# Patient Record
Sex: Female | Born: 2005 | Race: White | Hispanic: Yes | Marital: Single | Smoking: Never smoker
Health system: Southern US, Community
[De-identification: ages and names within clinical notes are randomized; demographics above are authoritative.]

## PROBLEM LIST (undated history)

## (undated) ENCOUNTER — Inpatient Hospital Stay (HOSPITAL_COMMUNITY): Payer: Self-pay

## (undated) DIAGNOSIS — D649 Anemia, unspecified: Secondary | ICD-10-CM

## (undated) HISTORY — PX: NO PAST SURGERIES: SHX2092

---
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
## 2024-01-03 ENCOUNTER — Ambulatory Visit: Payer: BC Managed Care – PPO | Admitting: Cardiology

## 2024-03-20 ENCOUNTER — Other Ambulatory Visit (HOSPITAL_COMMUNITY)
Admission: RE | Admit: 2024-03-20 | Discharge: 2024-03-20 | Disposition: A | Discharge status: Home / Self Care | Source: Ambulatory Visit | Attending: Obstetrics & Gynecology | Admitting: Obstetrics & Gynecology

## 2024-03-20 ENCOUNTER — Ambulatory Visit (INDEPENDENT_AMBULATORY_CARE_PROVIDER_SITE_OTHER): Payer: Self-pay

## 2024-03-20 VITALS — BP 113/76 | HR 79

## 2024-03-20 DIAGNOSIS — Z113 Encounter for screening for infections with a predominantly sexual mode of transmission: Secondary | ICD-10-CM | POA: Insufficient documentation

## 2024-03-20 NOTE — Progress Notes (Signed)
 SUBJECTIVE:  18 y.o. female who desires a STI screen. Denies abnormal vaginal discharge, bleeding or significant pelvic pain. No UTI symptoms. Denies history of known exposure to STD.  Patient's last menstrual period was 03/07/2024 (exact date).  OBJECTIVE:  She appears well.   ASSESSMENT:  STI Screen   PLAN:  Pt offered STI blood screening-requested GC, chlamydia, and trichomonas probe sent to lab.  Treatment: To be determined once lab results are received.  Pt follow up as needed.

## 2024-03-21 LAB — CERVICOVAGINAL ANCILLARY ONLY
Bacterial Vaginitis (gardnerella): POSITIVE — AB
Candida Glabrata: NEGATIVE
Candida Vaginitis: NEGATIVE
Chlamydia: NEGATIVE
Comment: NEGATIVE
Comment: NEGATIVE
Comment: NEGATIVE
Comment: NEGATIVE
Comment: NEGATIVE
Comment: NORMAL
Neisseria Gonorrhea: NEGATIVE
Trichomonas: NEGATIVE

## 2024-03-22 LAB — HIV ANTIBODY (ROUTINE TESTING W REFLEX): HIV Screen 4th Generation wRfx: NONREACTIVE

## 2024-03-22 LAB — HEPATITIS C ANTIBODY: Hep C Virus Ab: NONREACTIVE

## 2024-03-22 LAB — RPR: RPR Ser Ql: NONREACTIVE

## 2024-03-22 LAB — HEPATITIS B SURFACE ANTIGEN: Hepatitis B Surface Ag: NEGATIVE

## 2024-04-03 ENCOUNTER — Ambulatory Visit: Payer: BC Managed Care – PPO | Attending: Cardiology | Admitting: Cardiology

## 2024-04-16 ENCOUNTER — Other Ambulatory Visit: Payer: Self-pay | Admitting: Obstetrics & Gynecology

## 2024-04-16 DIAGNOSIS — Z113 Encounter for screening for infections with a predominantly sexual mode of transmission: Secondary | ICD-10-CM

## 2024-08-21 ENCOUNTER — Ambulatory Visit (INDEPENDENT_AMBULATORY_CARE_PROVIDER_SITE_OTHER)

## 2024-08-21 ENCOUNTER — Ambulatory Visit

## 2024-08-21 VITALS — BP 137/86 | HR 79

## 2024-08-21 DIAGNOSIS — Z3201 Encounter for pregnancy test, result positive: Secondary | ICD-10-CM | POA: Diagnosis not present

## 2024-08-21 LAB — POCT URINE PREGNANCY: Preg Test, Ur: POSITIVE — AB

## 2024-08-21 MED ORDER — PROMETHAZINE HCL 25 MG PO TABS: 25.0000 mg | ORAL_TABLET | Freq: Four times a day (QID) | ORAL | 1 refills | Status: AC | PRN

## 2024-08-21 MED ORDER — PRENATAL 28-0.8 MG PO TABS: 1.0000 | ORAL_TABLET | Freq: Every day | ORAL | 12 refills | Status: AC

## 2024-08-21 NOTE — Progress Notes (Signed)
 Kayla Newton here for a UPT. Pt had a positive upt at home. LMP is 07/20/24.     UPT in office Positive.    Reviewed medications and informed to start a PNV, if not already. Pt to follow up in 4 weeks for New OB visit.   Pt did request HCG blood draw, orders placed.

## 2024-08-22 ENCOUNTER — Ambulatory Visit: Payer: Self-pay | Admitting: Obstetrics and Gynecology

## 2024-08-22 LAB — BETA HCG QUANT (REF LAB): hCG Quant: 379 m[IU]/mL

## 2024-09-18 ENCOUNTER — Ambulatory Visit: Admitting: *Deleted

## 2024-09-18 ENCOUNTER — Other Ambulatory Visit (INDEPENDENT_AMBULATORY_CARE_PROVIDER_SITE_OTHER): Payer: Self-pay

## 2024-09-18 ENCOUNTER — Other Ambulatory Visit (HOSPITAL_COMMUNITY)
Admission: RE | Admit: 2024-09-18 | Discharge: 2024-09-18 | Disposition: A | Discharge status: Home / Self Care | Source: Ambulatory Visit | Attending: Obstetrics and Gynecology | Admitting: Obstetrics and Gynecology

## 2024-09-18 VITALS — BP 124/87 | HR 85 | Wt 125.3 lb

## 2024-09-18 DIAGNOSIS — Z3480 Encounter for supervision of other normal pregnancy, unspecified trimester: Secondary | ICD-10-CM

## 2024-09-18 DIAGNOSIS — Z3A08 8 weeks gestation of pregnancy: Secondary | ICD-10-CM | POA: Diagnosis not present

## 2024-09-18 DIAGNOSIS — Z3481 Encounter for supervision of other normal pregnancy, first trimester: Secondary | ICD-10-CM

## 2024-09-18 DIAGNOSIS — O3680X0 Pregnancy with inconclusive fetal viability, not applicable or unspecified: Secondary | ICD-10-CM

## 2024-09-18 MED ORDER — BLOOD PRESSURE KIT DEVI: 1.0000 | 0 refills | Status: AC

## 2024-09-18 NOTE — Progress Notes (Signed)
 New OB Intake  I connected with Kayla Newton  on 09/18/24 at  8:15 AM EST by In Person Visit and verified that I am speaking with the correct person using two identifiers. Nurse is located at [facility] and pt is located at [place].  I discussed the limitations, risks, security and privacy concerns of performing an evaluation and management service by telephone and the availability of in person appointments. I also discussed with the patient that there may be a patient responsible charge related to this service. The patient expressed understanding and agreed to proceed.  I explained I am completing New OB Intake today. We discussed EDD of 04/26/25 based on LMP of 07/20/25. Pt is G2P1001. I reviewed her allergies, medications and Medical/Surgical/OB history.    Patient Active Problem List   Diagnosis Date Noted   Breakthrough bleeding associated with intrauterine device (IUD) 01/10/2023   IUD (intrauterine device) in place 01/10/2023     Concerns addressed today  Delivery Plans Plans to deliver at [facility]. Discussed the nature of our practice with multiple providers including residents and students as well as female and female providers. Due to the size of the practice, the delivering provider may not be the same as those providing prenatal care.   Patient Not Asked interested in water birth.  MyChart/Babyscripts MyChart access verified. I explained pt will have some visits in office and some virtually. Babyscripts instructions given and order placed. Patient verifies receipt of registration text/e-mail. Account successfully created and app downloaded. If patient is a candidate for Optimized scheduling, add to sticky note.   Blood Pressure Cuff/Weight Scale Blood pressure cuff ordered for patient to pick-up from [employer]. Explained after first prenatal appt pt will check weekly and document in Babyscripts. Patient does not have weight scale; patient may purchase if they desire to  track weight weekly in Babyscripts.  Anatomy US  Explained first scheduled US  will be around 19 weeks. Anatomy US  scheduled for TBD at TBD.  Is patient a CenteringPregnancy candidate?  Declined Declined due to Group setting Not a candidate due to NA If accepted,    Is patient a candidate for Babyscripts Optimization? Yes, patient accepted    First visit review I reviewed new OB appt with patient. Explained pt will be seen by Dr. Erik at first visit. Discussed Kayla Newton genetic screening with patient. Requests Panorama. Routine prenatal labs OB Urine, GC/CC collected at today's visit. Initial prenatal labs deferred to New OB visit.   Last Pap No results found for: DIAGPAP  Kayla CHRISTELLA Ober, RN 09/18/2024  8:24 AM

## 2024-09-18 NOTE — Patient Instructions (Signed)

## 2024-09-20 LAB — CERVICOVAGINAL ANCILLARY ONLY
Chlamydia: NEGATIVE
Comment: NEGATIVE
Comment: NORMAL
Neisseria Gonorrhea: NEGATIVE

## 2024-09-21 LAB — URINE CULTURE, OB REFLEX

## 2024-09-21 LAB — CULTURE, OB URINE

## 2024-09-26 ENCOUNTER — Ambulatory Visit: Payer: Self-pay | Admitting: Obstetrics and Gynecology

## 2024-10-16 ENCOUNTER — Encounter: Admitting: Obstetrics and Gynecology

## 2024-10-24 ENCOUNTER — Ambulatory Visit: Admitting: Obstetrics & Gynecology

## 2024-10-24 VITALS — BP 125/80 | HR 87 | Wt 125.0 lb

## 2024-10-24 DIAGNOSIS — Z3480 Encounter for supervision of other normal pregnancy, unspecified trimester: Secondary | ICD-10-CM

## 2024-10-24 DIAGNOSIS — Z3A13 13 weeks gestation of pregnancy: Secondary | ICD-10-CM | POA: Diagnosis not present

## 2024-10-24 DIAGNOSIS — Z3483 Encounter for supervision of other normal pregnancy, third trimester: Secondary | ICD-10-CM

## 2024-10-24 DIAGNOSIS — Z131 Encounter for screening for diabetes mellitus: Secondary | ICD-10-CM

## 2024-10-24 NOTE — Progress Notes (Unsigned)
°  Subjective:NOB    Kayla Newton is a G2P1001 [redacted]w[redacted]d being seen today for her first obstetrical visit.  Her obstetrical history is significant for second pregnancy. Patient does intend to breast feed. Pregnancy history fully reviewed.  Patient reports no complaints.  Vitals:   10/24/24 0936  BP: 125/80  Pulse: 87  Weight: 125 lb (56.7 kg)    HISTORY: OB History  Gravida Para Term Preterm AB Living  2 1 1  0 0 1  SAB IAB Ectopic Multiple Live Births  0 0 0 0 1    # Outcome Date GA Lbr Len/2nd Weight Sex Type Anes PTL Lv  2 Current           1 Term 07/08/21 [redacted]w[redacted]d 19:27 / 00:23 6 lb 14.6 oz (3.135 kg) F Vag-Spont Local  LIV   Past Medical History:  Diagnosis Date   Anemia    Past Surgical History:  Procedure Laterality Date   NO PAST SURGERIES     Family History  Problem Relation Age of Onset   Hypertension Mother    Hyperlipidemia Father      Exam    Uterus:   14 week  Pelvic Exam:    Perineum:    Vulva:    Vagina:     pH:    Cervix:    Adnexa:    Bony Pelvis:   System: Breast:  Inspection negative   Skin: normal coloration and turgor, no rashes    Neurologic: oriented, normal mood   Extremities: normal strength, tone, and muscle mass   HEENT PERRLA and extra ocular movement intact   Mouth/Teeth mucous membranes moist, pharynx normal without lesions   Neck supple and no masses   Cardiovascular: regular rate and rhythm, no murmurs or gallops   Respiratory:  appears well, vitals normal, no respiratory distress, acyanotic, normal RR, chest clear, no wheezing, crepitations, rhonchi, normal symmetric air entry   Abdomen: soft, non-tender; bowel sounds normal; no masses,  no organomegaly   Urinary:       Assessment:    Pregnancy: G2P1001 Patient Active Problem List   Diagnosis Date Noted   Supervision of other normal pregnancy, antepartum 09/18/2024   Breakthrough bleeding associated with intrauterine device (IUD) 01/10/2023   IUD (intrauterine  device) in place 01/10/2023        Plan:     Initial labs drawn. Prenatal vitamins. Problem list reviewed and updated. Genetic Screening discussed : ordered.  Ultrasound discussed; fetal survey: ordered.  Follow up in 4 weeks. 50% of 30 min visit spent on counseling and coordination of care.     Lynwood Solomons 10/24/2024

## 2024-10-25 LAB — CBC/D/PLT+RPR+RH+ABO+RUBIGG...
Antibody Screen: NEGATIVE
Basophils Absolute: 0 x10E3/uL (ref 0.0–0.2)
Basos: 0 %
EOS (ABSOLUTE): 0.4 x10E3/uL (ref 0.0–0.4)
Eos: 5 %
HCV Ab: NONREACTIVE
HIV Screen 4th Generation wRfx: NONREACTIVE
Hematocrit: 38.9 % (ref 34.0–46.6)
Hemoglobin: 12.5 g/dL (ref 11.1–15.9)
Hepatitis B Surface Ag: NEGATIVE
Immature Grans (Abs): 0 x10E3/uL (ref 0.0–0.1)
Immature Granulocytes: 0 %
Lymphocytes Absolute: 2 x10E3/uL (ref 0.7–3.1)
Lymphs: 23 %
MCH: 27 pg (ref 26.6–33.0)
MCHC: 32.1 g/dL (ref 31.5–35.7)
MCV: 84 fL (ref 79–97)
Monocytes Absolute: 0.5 x10E3/uL (ref 0.1–0.9)
Monocytes: 5 %
Neutrophils Absolute: 5.7 x10E3/uL (ref 1.4–7.0)
Neutrophils: 67 %
Platelets: 202 x10E3/uL (ref 150–450)
RBC: 4.63 x10E6/uL (ref 3.77–5.28)
RDW: 16.1 % — ABNORMAL HIGH (ref 11.7–15.4)
RPR Ser Ql: NONREACTIVE
Rh Factor: POSITIVE
Rubella Antibodies, IGG: <0.9 {index} — ABNORMAL LOW (ref >0.99)
WBC: 8.6 x10E3/uL (ref 3.4–10.8)

## 2024-10-25 LAB — HEMOGLOBIN A1C
Est. average glucose Bld gHb Est-mCnc: 103 mg/dL
Hgb A1c MFr Bld: 5.2 % (ref 4.8–5.6)

## 2024-10-25 LAB — HCV INTERPRETATION

## 2024-10-29 LAB — PANORAMA PRENATAL TEST FULL PANEL:PANORAMA TEST PLUS 5 ADDITIONAL MICRODELETIONS: FETAL FRACTION: 10.5

## 2024-10-29 MED ORDER — METRONIDAZOLE 500 MG PO TABS: 500.0000 mg | ORAL_TABLET | Freq: Two times a day (BID) | ORAL | 0 refills | Status: AC

## 2024-11-21 ENCOUNTER — Ambulatory Visit (INDEPENDENT_AMBULATORY_CARE_PROVIDER_SITE_OTHER): Payer: Self-pay | Admitting: Physician Assistant

## 2024-11-21 VITALS — BP 111/65 | HR 80 | Wt 126.8 lb

## 2024-11-21 DIAGNOSIS — Z2839 Other underimmunization status: Secondary | ICD-10-CM | POA: Diagnosis not present

## 2024-11-21 DIAGNOSIS — Z3482 Encounter for supervision of other normal pregnancy, second trimester: Secondary | ICD-10-CM | POA: Diagnosis not present

## 2024-11-21 DIAGNOSIS — Z3480 Encounter for supervision of other normal pregnancy, unspecified trimester: Secondary | ICD-10-CM

## 2024-11-21 DIAGNOSIS — Z3490 Encounter for supervision of normal pregnancy, unspecified, unspecified trimester: Secondary | ICD-10-CM | POA: Insufficient documentation

## 2024-11-21 DIAGNOSIS — Z3A17 17 weeks gestation of pregnancy: Secondary | ICD-10-CM | POA: Diagnosis not present

## 2024-11-21 NOTE — Progress Notes (Signed)
" ° °  PRENATAL VISIT NOTE  Subjective:  Kayla Newton is a 19 y.o. G2P1001 at [redacted]w[redacted]d being seen today for ongoing prenatal care.  She is currently monitored for the following issues for this low-risk pregnancy and has IUD (intrauterine device) in place; Supervision of other normal pregnancy, antepartum; and Rubella non-immune status, antepartum on their problem list.  Patient reports no complaints.  Contractions: Not present. Vag. Bleeding: None.  Movement: Present. Denies leaking of fluid.   The following portions of the patient's history were reviewed and updated as appropriate: allergies, current medications, past family history, past medical history, past social history, past surgical history and problem list.   Objective:   Vitals:   11/21/24 0850  BP: 111/65  Pulse: 80  Weight: 126 lb 12.8 oz (57.5 kg)    Fetal Status:  Fetal Heart Rate (bpm): 147   Movement: Present    General: Alert, oriented and cooperative. Patient is in no acute distress.  Skin: Skin is warm and dry. No rash noted.   Cardiovascular: Normal heart rate noted  Respiratory: Normal respiratory effort, no problems with respiration noted  Abdomen: Soft, gravid, appropriate for gestational age.  Pain/Pressure: Present     Pelvic: Cervical exam deferred        Extremities: Normal range of motion.  Edema: None  Mental Status: Normal mood and affect. Normal behavior. Normal judgment and thought content.      10/24/2024    9:47 AM 09/18/2024    8:44 AM 08/16/2023    2:27 PM  Depression screen PHQ 2/9  Decreased Interest 0 0 1  Down, Depressed, Hopeless 0 0 0  PHQ - 2 Score 0 0 1  Altered sleeping 0 0   Tired, decreased energy 0 0   Change in appetite 0 0   Feeling bad or failure about yourself  0 0   Trouble concentrating 0 0   Moving slowly or fidgety/restless 0 0   Suicidal thoughts 0 0   PHQ-9 Score 0 0         10/24/2024    9:47 AM 09/18/2024    8:45 AM 08/16/2023    2:27 PM 01/05/2021    8:22  AM  GAD 7 : Generalized Anxiety Score  Nervous, Anxious, on Edge 0 0 1 0  Control/stop worrying 0 0 0 0  Worry too much - different things 0 0 1 0  Trouble relaxing 0 0 0 1  Restless 0 0 0 0  Easily annoyed or irritable 0 0 1 0  Afraid - awful might happen 0 0 0 1  Total GAD 7 Score 0 0 3 2    Assessment and Plan:  Pregnancy: G2P1001 at [redacted]w[redacted]d  1. Supervision of other normal pregnancy, antepartum (Primary) Patient doing well BP, FHR appropriate   2. [redacted] weeks gestation of pregnancy Anticipatory guidance about next visits/weeks of pregnancy given.   3. Rubella non-immune status, antepartum Needs MMR postpartum    Preterm labor symptoms and general obstetric precautions including but not limited to vaginal bleeding, contractions, leaking of fluid and fetal movement were reviewed in detail with the patient.  Please refer to After Visit Summary for other counseling recommendations.   Return in about 4 weeks (around 12/19/2024) for LOB.  Future Appointments  Date Time Provider Department Center  12/10/2024 11:30 AM [facility] - [facility] US  [facility] None    Jorene FORBES Moats, PA-C  "

## 2024-11-21 NOTE — Progress Notes (Signed)
 Pt presents for rob. Pt has no questions or concerns at this time.

## 2024-11-24 ENCOUNTER — Ambulatory Visit: Payer: Self-pay | Admitting: Physician Assistant

## 2024-11-24 LAB — AFP, SERUM, OPEN SPINA BIFIDA
AFP MoM: 1.15
AFP Value: 48.1 ng/mL
Gest. Age on Collection Date: 17 wk
Maternal Age At EDD: 20 a
OSBR Risk 1 IN: 7603
Test Results:: NEGATIVE
Weight: 127 [lb_av]

## 2024-11-29 ENCOUNTER — Other Ambulatory Visit: Payer: Self-pay

## 2024-11-29 ENCOUNTER — Inpatient Hospital Stay (HOSPITAL_COMMUNITY)
Admission: AD | Admit: 2024-11-29 | Discharge: 2024-11-29 | Disposition: A | Discharge status: Home / Self Care | Attending: Obstetrics & Gynecology | Admitting: Obstetrics & Gynecology

## 2024-11-29 DIAGNOSIS — O26892 Other specified pregnancy related conditions, second trimester: Secondary | ICD-10-CM | POA: Diagnosis present

## 2024-11-29 DIAGNOSIS — O26899 Other specified pregnancy related conditions, unspecified trimester: Secondary | ICD-10-CM

## 2024-11-29 DIAGNOSIS — R1010 Upper abdominal pain, unspecified: Secondary | ICD-10-CM | POA: Insufficient documentation

## 2024-11-29 DIAGNOSIS — Z3A18 18 weeks gestation of pregnancy: Secondary | ICD-10-CM | POA: Insufficient documentation

## 2024-11-29 DIAGNOSIS — R12 Heartburn: Secondary | ICD-10-CM | POA: Diagnosis not present

## 2024-11-29 DIAGNOSIS — R141 Gas pain: Secondary | ICD-10-CM | POA: Diagnosis not present

## 2024-11-29 DIAGNOSIS — R109 Unspecified abdominal pain: Secondary | ICD-10-CM

## 2024-11-29 DIAGNOSIS — Z3480 Encounter for supervision of other normal pregnancy, unspecified trimester: Secondary | ICD-10-CM

## 2024-11-29 LAB — URINALYSIS, ROUTINE W REFLEX MICROSCOPIC
Bilirubin Urine: NEGATIVE
Glucose, UA: NEGATIVE mg/dL
Hgb urine dipstick: NEGATIVE
Ketones, ur: 5 mg/dL — AB
Nitrite: NEGATIVE
Protein, ur: NEGATIVE mg/dL
Specific Gravity, Urine: 1.018 (ref 1.005–1.030)
pH: 6 (ref 5.0–8.0)

## 2024-11-29 MED ORDER — METOCLOPRAMIDE HCL 10 MG PO TABS
10.0000 mg | ORAL_TABLET | Freq: Once | ORAL | Status: AC
  Administered 2024-11-29: 10 mg via ORAL
  Filled 2024-11-29: qty 1

## 2024-11-29 MED ORDER — FAMOTIDINE 20 MG PO TABS
20.0000 mg | ORAL_TABLET | Freq: Once | ORAL | Status: AC
  Administered 2024-11-29: 20 mg via ORAL
  Filled 2024-11-29: qty 1

## 2024-11-29 MED ORDER — SIMETHICONE 80 MG PO CHEW
160.0000 mg | CHEWABLE_TABLET | Freq: Once | ORAL | Status: AC
  Administered 2024-11-29: 160 mg via ORAL
  Filled 2024-11-29: qty 2

## 2024-11-29 MED ORDER — SIMETHICONE 80 MG PO TABS: 160.0000 mg | ORAL_TABLET | Freq: Four times a day (QID) | ORAL | 0 refills | Status: AC | PRN

## 2024-11-29 MED ORDER — FAMOTIDINE 20 MG PO TABS: 20.0000 mg | ORAL_TABLET | Freq: Two times a day (BID) | ORAL | 0 refills | Status: AC

## 2024-11-29 MED ORDER — METOCLOPRAMIDE HCL 10 MG PO TABS: 10.0000 mg | ORAL_TABLET | Freq: Four times a day (QID) | ORAL | 0 refills | Status: AC

## 2024-11-29 NOTE — MAU Provider Note (Signed)
 CC/ ABDOMINAL PAIN AND CONSTIPATION    SUBJECTIVE [name] is a 19 y.o. G2P1001 @ [redacted]w[redacted]d with confirmed IUP. Presents to  MAU today with complaint of upper abdominal pain, heartburn  and concern for constipation. Patient reports no fever, Chills, N/V and able to eat and drink without difficulty. Has been able to have daily BM's but reports straining. Offers no OB c/o at this time. Denies VB, LOF, and reports fetal movements   PNC received at [facility]   OBJECTIVE BP 109/66 (BP Location: Right Arm)   Pulse 84   Temp 98.2 F (36.8 C) (Oral)   Resp 16   Ht 5' 1 (1.549 m)   Wt 58 kg   LMP 07/20/2024 (Exact Date)   SpO2 100%   BMI 24.15 kg/m  Physical Exam Vitals and nursing note reviewed.  Constitutional:      General: She is not in acute distress.    Appearance: Normal appearance. She is well-developed. She is not ill-appearing.  HENT:     Head: Normocephalic.     Nose: Nose normal.     Mouth/Throat:     Mouth: Mucous membranes are moist.  Cardiovascular:     Rate and Rhythm: Normal rate.  Pulmonary:     Effort: Pulmonary effort is normal.  Abdominal:     General: Bowel sounds are normal. There is distension.     Palpations: Abdomen is soft.     Tenderness: There is abdominal tenderness in the epigastric area. There is no guarding or rebound. Negative signs include Murphy's sign.     Comments: Tympanic Bowel sounds   Musculoskeletal:        General: Normal range of motion.     Cervical back: Normal range of motion.  Skin:    General: Skin is warm.  Neurological:     Mental Status: She is alert and oriented to person, place, and time.  Psychiatric:        Mood and Affect: Mood normal.        Behavior: Behavior normal.        Thought Content: Thought content normal.        Judgment: Judgment normal.     FHT: 152 via doppler   MDM   MODERATE   Prenatal chart reviewed Physical exam performed UA: Negative Pepcid , Reglan , and simethicone  ordered  for abdominal discomfort   Patient reassessed  RN informed me @ 1530 that pain is now resolved after medications given Plan for discharge with RX's for Pepcid , Reglan , and simethicone   S/R/B discussed and patient agreeable to treatment   Orders Placed This Encounter  Procedures   Urinalysis, Routine w reflex microscopic -Urine, Clean Catch    Standing Status:   Standing    Number of Occurrences:   1    Specimen Source:   Urine, Clean Catch [76]   Discharge patient Discharge disposition: 01-Home or Self Care; Discharge patient date: 11/29/2024    Standing Status:   Standing    Number of Occurrences:   1    Discharge disposition:   01-Home or Self Care [1]    Discharge patient date:   11/29/2024      Results for orders placed or performed during the hospital encounter of 11/29/24 (from the past 24 hours)  Urinalysis, Routine w reflex microscopic -Urine, Clean Catch     Status: Abnormal   Collection Time: 11/29/24  2:12 PM  Result Value Ref Range   Color, Urine YELLOW YELLOW   APPearance CLOUDY (  A) CLEAR   Specific Gravity, Urine 1.018 1.005 - 1.030   pH 6.0 5.0 - 8.0   Glucose, UA NEGATIVE NEGATIVE mg/dL   Hgb urine dipstick NEGATIVE NEGATIVE   Bilirubin Urine NEGATIVE NEGATIVE   Ketones, ur 5 (A) NEGATIVE mg/dL   Protein, ur NEGATIVE NEGATIVE mg/dL   Nitrite NEGATIVE NEGATIVE   Leukocytes,Ua MODERATE (A) NEGATIVE   RBC / HPF 0-5 0 - 5 RBC/hpf   WBC, UA 0-5 0 - 5 WBC/hpf   Bacteria, UA FEW (A) NONE SEEN   Squamous Epithelial / HPF 11-20 0 - 5 /HPF   Mucus PRESENT      Meds ordered this encounter  Medications   simethicone  (MYLICON) chewable tablet 160 mg   famotidine  (PEPCID ) tablet 20 mg   metoCLOPramide  (REGLAN ) tablet 10 mg   famotidine  (PEPCID ) 20 MG tablet    Sig: Take 1 tablet (20 mg total) by mouth 2 (two) times daily.    Dispense:  60 tablet    Refill:  0    Supervising Provider:   PRATT, TANYA S [[id number]]   metoCLOPramide  (REGLAN ) 10 MG tablet    Sig: Take 1  tablet (10 mg total) by mouth every 6 (six) hours.    Dispense:  30 tablet    Refill:  0    Supervising Provider:   PRATT, TANYA S [[id number]]   Simethicone  80 MG TABS    Sig: Take 2 tablets (160 mg total) by mouth every 6 (six) hours as needed (gas pain).    Dispense:  30 tablet    Refill:  0    Supervising Provider:   PRATT, TANYA S [[id number]]     ASSESSMENT Medical screening exam complete   Abdominal pain affecting pregnancy Likely gas related d/t relief with medication prescribed Regan 10 mg PO administered with relief   [redacted] weeks gestation of pregnancy IUP confirmed FHR 152  Abdominal gas pain Simethicone  160 mg PO x1 with relief   Heartburn during pregnancy in second trimester Pepcid  20 mg PO x 1 with relief noted    PLAN Future Appointments  Date Time Provider Department Center  12/10/2024 11:30 AM [facility] - [facility] US  [facility] None  12/19/2024  2:30 PM Eveline Lynwood MATSU, MD [facility] None    Discharge from MAU in stable condition  See AVS for full description of educational information and instructions provided to the patient at time of discharge  Warning signs for worsening condition that would warrant emergency follow-up discussed  Patient may return to MAU as needed   Allergies as of 11/29/2024   No Known Allergies      Medication List     TAKE these medications    Blood Pressure Kit Devi 1 Device by Does not apply route once a week.   famotidine  20 MG tablet Commonly known as: Pepcid  Take 1 tablet (20 mg total) by mouth 2 (two) times daily.   metoCLOPramide  10 MG tablet Commonly known as: REGLAN  Take 1 tablet (10 mg total) by mouth every 6 (six) hours.   metroNIDAZOLE  500 MG tablet Commonly known as: FLAGYL  Take 1 tablet (500 mg total) by mouth 2 (two) times daily.   Prenatal 28-0.8 MG Tabs Take 1 tablet by mouth daily.   promethazine  25 MG tablet Commonly known as: PHENERGAN  Take 1 tablet (25 mg total) by mouth every 6 (six) hours as needed for nausea  or vomiting.   Simethicone  80 MG Tabs Take 2 tablets (160 mg total) by mouth every 6 (six)  hours as needed (gas pain).         Littie Olam LABOR, NP 11/29/2024 3:50 PM  a

## 2024-11-29 NOTE — MAU Note (Signed)
 Kayla Newton is a 19 y.o. at [redacted]w[redacted]d here in MAU reporting: woke up this morning with sharp upper abd pain- worse when I try and turn side to side. Denies VB or change in discharge, s/s UTI Last BM 2 days- usually everyday. Unable to go this morning LMP:  Onset of complaint: this am Pain score: 6/10 Vitals:   11/29/24 1316  BP: 109/66  Pulse: 84  Resp: 16  Temp: 98.2 F (36.8 C)  SpO2: 100%     FHT: 152  Lab orders placed from triage: ua

## 2024-11-29 NOTE — Discharge Instructions (Signed)

## 2024-12-09 ENCOUNTER — Encounter: Payer: Self-pay | Admitting: *Deleted

## 2024-12-10 ENCOUNTER — Other Ambulatory Visit

## 2024-12-12 ENCOUNTER — Other Ambulatory Visit

## 2024-12-12 ENCOUNTER — Other Ambulatory Visit: Payer: Self-pay | Admitting: Obstetrics and Gynecology

## 2024-12-12 ENCOUNTER — Other Ambulatory Visit: Payer: Self-pay | Admitting: Obstetrics & Gynecology

## 2024-12-12 ENCOUNTER — Other Ambulatory Visit: Payer: Self-pay | Admitting: *Deleted

## 2024-12-12 DIAGNOSIS — Z3A19 19 weeks gestation of pregnancy: Secondary | ICD-10-CM | POA: Diagnosis not present

## 2024-12-12 DIAGNOSIS — Z363 Encounter for antenatal screening for malformations: Secondary | ICD-10-CM

## 2024-12-12 DIAGNOSIS — Z3480 Encounter for supervision of other normal pregnancy, unspecified trimester: Secondary | ICD-10-CM

## 2024-12-12 NOTE — Progress Notes (Signed)
 US  20+5 wks,cephalic,anterior placenta gr 0,CX 3.9 cm,SVP of fluid 4.9 cm,FHR 151 bpm,EFW 317 g 11%,AC 25%,anatomy complete,patient is from a sister office

## 2024-12-19 ENCOUNTER — Encounter: Payer: Self-pay | Admitting: Obstetrics & Gynecology
